# Patient Record
Sex: Female | Born: 1990 | Race: White | Hispanic: No | Marital: Single
Health system: Southern US, Community
[De-identification: ages and names within clinical notes are randomized; demographics above are authoritative.]

---
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
## 2010-12-04 ENCOUNTER — Emergency Department (HOSPITAL_BASED_OUTPATIENT_CLINIC_OR_DEPARTMENT_OTHER)
Admission: EM | Admit: 2010-12-04 | Discharge: 2010-12-05 | Disposition: A | Discharge status: Home / Self Care | Payer: Commercial Managed Care - PPO | Attending: Emergency Medicine | Admitting: Emergency Medicine

## 2010-12-04 DIAGNOSIS — R112 Nausea with vomiting, unspecified: Secondary | ICD-10-CM | POA: Insufficient documentation

## 2010-12-04 DIAGNOSIS — R197 Diarrhea, unspecified: Secondary | ICD-10-CM | POA: Insufficient documentation

## 2010-12-04 LAB — URINALYSIS, ROUTINE W REFLEX MICROSCOPIC
Bilirubin Urine: NEGATIVE
Glucose, UA: NEGATIVE mg/dL
Specific Gravity, Urine: 1.02 (ref 1.005–1.030)
pH: 5.5 (ref 5.0–8.0)

## 2010-12-04 LAB — URINE MICROSCOPIC-ADD ON

## 2010-12-04 LAB — COMPREHENSIVE METABOLIC PANEL
AST: 20 U/L (ref 0–37)
CO2: 22 mEq/L (ref 19–32)
Calcium: 9.9 mg/dL (ref 8.4–10.5)
Chloride: 102 mEq/L (ref 96–112)
Glucose, Bld: 150 mg/dL — ABNORMAL HIGH (ref 70–99)
Potassium: 3.5 mEq/L (ref 3.5–5.1)
Sodium: 142 mEq/L (ref 135–145)

## 2010-12-04 LAB — DIFFERENTIAL
Basophils Absolute: 0 10*3/uL (ref 0.0–0.1)
Lymphocytes Relative: 6 % — ABNORMAL LOW (ref 12–46)
Monocytes Absolute: 1.6 10*3/uL — ABNORMAL HIGH (ref 0.1–1.0)
Neutro Abs: 17 10*3/uL — ABNORMAL HIGH (ref 1.7–7.7)

## 2010-12-04 LAB — CBC
HCT: 42.9 % (ref 36.0–46.0)
Hemoglobin: 15.1 g/dL — ABNORMAL HIGH (ref 12.0–15.0)
MCHC: 35.2 g/dL (ref 30.0–36.0)

## 2013-09-06 ENCOUNTER — Inpatient Hospital Stay: Payer: Self-pay | Admitting: Surgery

## 2013-09-06 LAB — URINALYSIS, COMPLETE
BILIRUBIN, UR: NEGATIVE
GLUCOSE, UR: NEGATIVE mg/dL (ref 0–75)
NITRITE: POSITIVE
PH: 5 (ref 4.5–8.0)
Protein: NEGATIVE
SPECIFIC GRAVITY: 1.021 (ref 1.003–1.030)
WBC UR: 4 /HPF (ref 0–5)

## 2013-09-06 LAB — CBC WITH DIFFERENTIAL/PLATELET
BASOS PCT: 0.3 %
Basophil #: 0 10*3/uL (ref 0.0–0.1)
EOS ABS: 0 10*3/uL (ref 0.0–0.7)
Eosinophil %: 0.1 %
HCT: 38.8 % (ref 35.0–47.0)
HGB: 12.8 g/dL (ref 12.0–16.0)
LYMPHS ABS: 1.5 10*3/uL (ref 1.0–3.6)
LYMPHS PCT: 10.5 %
MCH: 30 pg (ref 26.0–34.0)
MCHC: 33.1 g/dL (ref 32.0–36.0)
MCV: 91 fL (ref 80–100)
Monocyte #: 1.4 x10 3/mm — ABNORMAL HIGH (ref 0.2–0.9)
Monocyte %: 9.6 %
NEUTROS PCT: 79.5 %
Neutrophil #: 11.7 10*3/uL — ABNORMAL HIGH (ref 1.4–6.5)
Platelet: 235 10*3/uL (ref 150–440)
RBC: 4.28 10*6/uL (ref 3.80–5.20)
RDW: 13.2 % (ref 11.5–14.5)
WBC: 14.7 10*3/uL — ABNORMAL HIGH (ref 3.6–11.0)

## 2013-09-06 LAB — COMPREHENSIVE METABOLIC PANEL
ALBUMIN: 3.7 g/dL (ref 3.4–5.0)
Alkaline Phosphatase: 61 U/L
Anion Gap: 4 — ABNORMAL LOW (ref 7–16)
BUN: 11 mg/dL (ref 7–18)
Bilirubin,Total: 0.6 mg/dL (ref 0.2–1.0)
CHLORIDE: 106 mmol/L (ref 98–107)
CO2: 26 mmol/L (ref 21–32)
Calcium, Total: 8.8 mg/dL (ref 8.5–10.1)
Creatinine: 0.75 mg/dL (ref 0.60–1.30)
EGFR (African American): >60
Glucose: 78 mg/dL (ref 65–99)
Osmolality: 270 (ref 275–301)
POTASSIUM: 3.9 mmol/L (ref 3.5–5.1)
SGOT(AST): 27 U/L (ref 15–37)
SGPT (ALT): 19 U/L (ref 12–78)
SODIUM: 136 mmol/L (ref 136–145)
TOTAL PROTEIN: 7.9 g/dL (ref 6.4–8.2)

## 2013-09-06 LAB — LIPASE, BLOOD: Lipase: 88 U/L (ref 73–393)

## 2013-09-07 LAB — BASIC METABOLIC PANEL
Anion Gap: 3 — ABNORMAL LOW (ref 7–16)
BUN: 9 mg/dL (ref 7–18)
CHLORIDE: 105 mmol/L (ref 98–107)
CREATININE: 0.86 mg/dL (ref 0.60–1.30)
Calcium, Total: 8.3 mg/dL — ABNORMAL LOW (ref 8.5–10.1)
Co2: 29 mmol/L (ref 21–32)
EGFR (Non-African Amer.): >60
GLUCOSE: 128 mg/dL — AB (ref 65–99)
Osmolality: 274 (ref 275–301)
Potassium: 4 mmol/L (ref 3.5–5.1)
Sodium: 137 mmol/L (ref 136–145)

## 2013-09-07 LAB — CBC WITH DIFFERENTIAL/PLATELET
BASOS ABS: 0 10*3/uL (ref 0.0–0.1)
BASOS PCT: 0.1 %
EOS PCT: 0 %
Eosinophil #: 0 10*3/uL (ref 0.0–0.7)
HCT: 33.4 % — ABNORMAL LOW (ref 35.0–47.0)
HGB: 11.5 g/dL — AB (ref 12.0–16.0)
Lymphocyte #: 0.9 10*3/uL — ABNORMAL LOW (ref 1.0–3.6)
Lymphocyte %: 6 %
MCH: 31.2 pg (ref 26.0–34.0)
MCHC: 34.3 g/dL (ref 32.0–36.0)
MCV: 91 fL (ref 80–100)
MONOS PCT: 6.6 %
Monocyte #: 1 x10 3/mm — ABNORMAL HIGH (ref 0.2–0.9)
NEUTROS ABS: 13.6 10*3/uL — AB (ref 1.4–6.5)
Neutrophil %: 87.3 %
PLATELETS: 198 10*3/uL (ref 150–440)
RBC: 3.68 10*6/uL — AB (ref 3.80–5.20)
RDW: 13.1 % (ref 11.5–14.5)
WBC: 15.6 10*3/uL — ABNORMAL HIGH (ref 3.6–11.0)

## 2013-09-08 LAB — CBC WITH DIFFERENTIAL/PLATELET
BASOS ABS: 0 10*3/uL (ref 0.0–0.1)
Basophil %: 0.1 %
EOS PCT: 0 %
Eosinophil #: 0 10*3/uL (ref 0.0–0.7)
HCT: 35.9 % (ref 35.0–47.0)
HGB: 11.8 g/dL — AB (ref 12.0–16.0)
Lymphocyte #: 0.6 10*3/uL — ABNORMAL LOW (ref 1.0–3.6)
Lymphocyte %: 3.6 %
MCH: 30 pg (ref 26.0–34.0)
MCHC: 32.8 g/dL (ref 32.0–36.0)
MCV: 92 fL (ref 80–100)
MONOS PCT: 5 %
Monocyte #: 0.8 x10 3/mm (ref 0.2–0.9)
NEUTROS ABS: 15.1 10*3/uL — AB (ref 1.4–6.5)
NEUTROS PCT: 91.3 %
Platelet: 257 10*3/uL (ref 150–440)
RBC: 3.92 10*6/uL (ref 3.80–5.20)
RDW: 13 % (ref 11.5–14.5)
WBC: 16.6 10*3/uL — ABNORMAL HIGH (ref 3.6–11.0)

## 2013-09-08 LAB — BASIC METABOLIC PANEL
ANION GAP: 4 — AB (ref 7–16)
BUN: 9 mg/dL (ref 7–18)
CALCIUM: 8.7 mg/dL (ref 8.5–10.1)
CO2: 29 mmol/L (ref 21–32)
Chloride: 101 mmol/L (ref 98–107)
Creatinine: 0.87 mg/dL (ref 0.60–1.30)
EGFR (African American): >60
EGFR (Non-African Amer.): >60
GLUCOSE: 127 mg/dL — AB (ref 65–99)
Osmolality: 269 (ref 275–301)
POTASSIUM: 4.4 mmol/L (ref 3.5–5.1)
Sodium: 134 mmol/L — ABNORMAL LOW (ref 136–145)

## 2013-09-09 LAB — BASIC METABOLIC PANEL
Anion Gap: 3 — ABNORMAL LOW (ref 7–16)
BUN: 8 mg/dL (ref 7–18)
CO2: 27 mmol/L (ref 21–32)
Calcium, Total: 7.2 mg/dL — ABNORMAL LOW (ref 8.5–10.1)
Chloride: 108 mmol/L — ABNORMAL HIGH (ref 98–107)
Creatinine: 0.7 mg/dL (ref 0.60–1.30)
GLUCOSE: 78 mg/dL (ref 65–99)
OSMOLALITY: 273 (ref 275–301)
Potassium: 3.6 mmol/L (ref 3.5–5.1)
SODIUM: 138 mmol/L (ref 136–145)

## 2013-09-09 LAB — CBC WITH DIFFERENTIAL/PLATELET
BASOS ABS: 0 10*3/uL (ref 0.0–0.1)
BASOS PCT: 0.2 %
EOS ABS: 0.1 10*3/uL (ref 0.0–0.7)
Eosinophil %: 0.5 %
HCT: 34.4 % — ABNORMAL LOW (ref 35.0–47.0)
HGB: 11.6 g/dL — ABNORMAL LOW (ref 12.0–16.0)
LYMPHS PCT: 9 %
Lymphocyte #: 1 10*3/uL (ref 1.0–3.6)
MCH: 30.8 pg (ref 26.0–34.0)
MCHC: 33.7 g/dL (ref 32.0–36.0)
MCV: 91 fL (ref 80–100)
MONOS PCT: 7.9 %
Monocyte #: 0.9 x10 3/mm (ref 0.2–0.9)
NEUTROS PCT: 82.4 %
Neutrophil #: 8.9 10*3/uL — ABNORMAL HIGH (ref 1.4–6.5)
PLATELETS: 239 10*3/uL (ref 150–440)
RBC: 3.77 10*6/uL — ABNORMAL LOW (ref 3.80–5.20)
RDW: 13.3 % (ref 11.5–14.5)
WBC: 10.8 10*3/uL (ref 3.6–11.0)

## 2013-09-11 LAB — CBC WITH DIFFERENTIAL/PLATELET
BASOS ABS: 0 10*3/uL (ref 0.0–0.1)
Basophil %: 0.3 %
EOS ABS: 0.1 10*3/uL (ref 0.0–0.7)
Eosinophil %: 0.9 %
HCT: 34.9 % — ABNORMAL LOW (ref 35.0–47.0)
HGB: 11.5 g/dL — ABNORMAL LOW (ref 12.0–16.0)
Lymphocyte #: 1.2 10*3/uL (ref 1.0–3.6)
Lymphocyte %: 8 %
MCH: 29.7 pg (ref 26.0–34.0)
MCHC: 33 g/dL (ref 32.0–36.0)
MCV: 90 fL (ref 80–100)
MONOS PCT: 9.1 %
Monocyte #: 1.4 x10 3/mm — ABNORMAL HIGH (ref 0.2–0.9)
Neutrophil #: 12.2 10*3/uL — ABNORMAL HIGH (ref 1.4–6.5)
Neutrophil %: 81.7 %
Platelet: 264 10*3/uL (ref 150–440)
RBC: 3.87 10*6/uL (ref 3.80–5.20)
RDW: 13.4 % (ref 11.5–14.5)
WBC: 14.9 10*3/uL — AB (ref 3.6–11.0)

## 2013-09-11 LAB — PATHOLOGY REPORT

## 2013-09-18 ENCOUNTER — Ambulatory Visit: Payer: Self-pay | Admitting: Surgery

## 2013-09-18 LAB — URINALYSIS, COMPLETE
Bilirubin,UR: NEGATIVE
Glucose,UR: NEGATIVE mg/dL
Ketone: NEGATIVE
Leukocyte Esterase: NEGATIVE
Nitrite: NEGATIVE
Ph: 6.5
Protein: 30
Specific Gravity: 1.025

## 2013-09-19 ENCOUNTER — Inpatient Hospital Stay: Payer: Self-pay | Admitting: Surgery

## 2013-09-19 ENCOUNTER — Ambulatory Visit: Payer: Self-pay | Admitting: Surgery

## 2013-09-20 LAB — CBC WITH DIFFERENTIAL/PLATELET
BASOS PCT: 0.3 %
Basophil #: 0 10*3/uL (ref 0.0–0.1)
EOS PCT: 0.1 %
Eosinophil #: 0 10*3/uL (ref 0.0–0.7)
HCT: 31.2 % — AB (ref 35.0–47.0)
HGB: 10.5 g/dL — ABNORMAL LOW (ref 12.0–16.0)
LYMPHS ABS: 2.1 10*3/uL (ref 1.0–3.6)
Lymphocyte %: 13.2 %
MCH: 30.1 pg (ref 26.0–34.0)
MCHC: 33.6 g/dL (ref 32.0–36.0)
MCV: 90 fL (ref 80–100)
MONO ABS: 1.8 x10 3/mm — AB (ref 0.2–0.9)
Monocyte %: 11.3 %
NEUTROS PCT: 75.1 %
Neutrophil #: 11.7 10*3/uL — ABNORMAL HIGH (ref 1.4–6.5)
PLATELETS: 423 10*3/uL (ref 150–440)
RBC: 3.48 10*6/uL — AB (ref 3.80–5.20)
RDW: 13.6 % (ref 11.5–14.5)
WBC: 15.6 10*3/uL — AB (ref 3.6–11.0)

## 2013-09-20 LAB — APTT: ACTIVATED PTT: 49.6 s — AB (ref 23.6–35.9)

## 2013-09-20 LAB — PROTIME-INR
INR: 1.2
Prothrombin Time: 15 secs — ABNORMAL HIGH (ref 11.5–14.7)

## 2013-09-20 LAB — BASIC METABOLIC PANEL
Anion Gap: 4 — ABNORMAL LOW (ref 7–16)
BUN: 10 mg/dL (ref 7–18)
CALCIUM: 8.4 mg/dL — AB (ref 8.5–10.1)
CO2: 28 mmol/L (ref 21–32)
CREATININE: 0.81 mg/dL (ref 0.60–1.30)
Chloride: 103 mmol/L (ref 98–107)
GLUCOSE: 88 mg/dL (ref 65–99)
Osmolality: 269 (ref 275–301)
Potassium: 4 mmol/L (ref 3.5–5.1)
Sodium: 135 mmol/L — ABNORMAL LOW (ref 136–145)

## 2013-09-21 LAB — CBC WITH DIFFERENTIAL/PLATELET
BASOS PCT: 0.2 %
Basophil #: 0 10*3/uL (ref 0.0–0.1)
Eosinophil #: 0 10*3/uL (ref 0.0–0.7)
Eosinophil %: 0 %
HCT: 29.1 % — AB (ref 35.0–47.0)
HGB: 9.5 g/dL — ABNORMAL LOW (ref 12.0–16.0)
LYMPHS ABS: 1.3 10*3/uL (ref 1.0–3.6)
LYMPHS PCT: 7.2 %
MCH: 29.1 pg (ref 26.0–34.0)
MCHC: 32.5 g/dL (ref 32.0–36.0)
MCV: 90 fL (ref 80–100)
MONOS PCT: 8.4 %
Monocyte #: 1.6 x10 3/mm — ABNORMAL HIGH (ref 0.2–0.9)
NEUTROS ABS: 15.7 10*3/uL — AB (ref 1.4–6.5)
NEUTROS PCT: 84.2 %
Platelet: 424 10*3/uL (ref 150–440)
RBC: 3.25 10*6/uL — AB (ref 3.80–5.20)
RDW: 13.5 % (ref 11.5–14.5)
WBC: 18.6 10*3/uL — AB (ref 3.6–11.0)

## 2013-09-25 LAB — CBC WITH DIFFERENTIAL/PLATELET
BASOS PCT: 1.1 %
Basophil #: 0.1 10*3/uL (ref 0.0–0.1)
EOS ABS: 0.1 10*3/uL (ref 0.0–0.7)
EOS PCT: 1.1 %
HCT: 30.9 % — AB (ref 35.0–47.0)
HGB: 10.5 g/dL — ABNORMAL LOW (ref 12.0–16.0)
LYMPHS PCT: 24.4 %
Lymphocyte #: 1.9 10*3/uL (ref 1.0–3.6)
MCH: 30.3 pg (ref 26.0–34.0)
MCHC: 34 g/dL (ref 32.0–36.0)
MCV: 89 fL (ref 80–100)
MONO ABS: 1 x10 3/mm — AB (ref 0.2–0.9)
Monocyte %: 12.5 %
Neutrophil #: 4.8 10*3/uL (ref 1.4–6.5)
Neutrophil %: 60.9 %
PLATELETS: 481 10*3/uL — AB (ref 150–440)
RBC: 3.46 10*6/uL — ABNORMAL LOW (ref 3.80–5.20)
RDW: 13.8 % (ref 11.5–14.5)
WBC: 7.9 10*3/uL (ref 3.6–11.0)

## 2013-09-26 LAB — URINALYSIS, COMPLETE
BLOOD: NEGATIVE
Bilirubin,UR: NEGATIVE
Glucose,UR: NEGATIVE mg/dL (ref 0–75)
Ketone: NEGATIVE
NITRITE: NEGATIVE
PH: 7 (ref 4.5–8.0)
Protein: NEGATIVE
SPECIFIC GRAVITY: 1.018 (ref 1.003–1.030)

## 2013-09-27 LAB — CBC WITH DIFFERENTIAL/PLATELET
Basophil: 2 %
EOS PCT: 4 %
HCT: 32.7 % — ABNORMAL LOW (ref 35.0–47.0)
HGB: 10.9 g/dL — AB (ref 12.0–16.0)
Lymphocytes: 37 %
MCH: 29.9 pg (ref 26.0–34.0)
MCHC: 33.4 g/dL (ref 32.0–36.0)
MCV: 90 fL (ref 80–100)
METAMYELOCYTE: 1 %
MONOS PCT: 11 %
PLATELETS: 507 10*3/uL — AB (ref 150–440)
RBC: 3.64 10*6/uL — AB (ref 3.80–5.20)
RDW: 14 % (ref 11.5–14.5)
SEGMENTED NEUTROPHILS: 45 %
WBC: 6.4 10*3/uL (ref 3.6–11.0)

## 2013-09-27 LAB — BASIC METABOLIC PANEL
Anion Gap: 3 — ABNORMAL LOW (ref 7–16)
BUN: 13 mg/dL (ref 7–18)
CALCIUM: 8.9 mg/dL (ref 8.5–10.1)
Chloride: 105 mmol/L (ref 98–107)
Co2: 29 mmol/L (ref 21–32)
Creatinine: 0.85 mg/dL (ref 0.60–1.30)
EGFR (African American): >60
Glucose: 88 mg/dL (ref 65–99)
OSMOLALITY: 273 (ref 275–301)
Potassium: 4.6 mmol/L (ref 3.5–5.1)
SODIUM: 137 mmol/L (ref 136–145)

## 2013-09-27 LAB — BODY FLUID CULTURE

## 2013-10-05 ENCOUNTER — Other Ambulatory Visit: Payer: Self-pay | Admitting: Surgery

## 2013-10-05 LAB — CBC WITH DIFFERENTIAL/PLATELET
BASOS ABS: 0.1 10*3/uL (ref 0.0–0.1)
Basophil %: 2 %
EOS ABS: 0.1 10*3/uL (ref 0.0–0.7)
Eosinophil %: 1.9 %
HCT: 36.6 % (ref 35.0–47.0)
HGB: 11.9 g/dL — AB (ref 12.0–16.0)
Lymphocyte #: 2.6 10*3/uL (ref 1.0–3.6)
Lymphocyte %: 41.5 %
MCH: 29.5 pg (ref 26.0–34.0)
MCHC: 32.5 g/dL (ref 32.0–36.0)
MCV: 91 fL (ref 80–100)
MONO ABS: 0.7 x10 3/mm (ref 0.2–0.9)
Monocyte %: 11.6 %
NEUTROS ABS: 2.7 10*3/uL (ref 1.4–6.5)
NEUTROS PCT: 43 %
Platelet: 300 10*3/uL (ref 150–440)
RBC: 4.03 10*6/uL (ref 3.80–5.20)
RDW: 15.3 % — ABNORMAL HIGH (ref 11.5–14.5)
WBC: 6.2 10*3/uL (ref 3.6–11.0)

## 2013-10-05 LAB — BASIC METABOLIC PANEL
ANION GAP: 4 — AB (ref 7–16)
BUN: 22 mg/dL — ABNORMAL HIGH (ref 7–18)
CALCIUM: 9 mg/dL (ref 8.5–10.1)
CHLORIDE: 108 mmol/L — AB (ref 98–107)
CREATININE: 0.86 mg/dL (ref 0.60–1.30)
Co2: 28 mmol/L (ref 21–32)
EGFR (Non-African Amer.): >60
GLUCOSE: 120 mg/dL — AB (ref 65–99)
Osmolality: 284 (ref 275–301)
Potassium: 4.6 mmol/L (ref 3.5–5.1)
Sodium: 140 mmol/L (ref 136–145)

## 2014-06-13 LAB — LIPASE, BLOOD: Lipase: 95 U/L (ref 73–393)

## 2014-06-13 LAB — COMPREHENSIVE METABOLIC PANEL
AST: 12 U/L — AB (ref 15–37)
Albumin: 4.2 g/dL (ref 3.4–5.0)
Alkaline Phosphatase: 57 U/L
Anion Gap: 8 (ref 7–16)
BILIRUBIN TOTAL: 0.9 mg/dL (ref 0.2–1.0)
BUN: 13 mg/dL (ref 7–18)
CALCIUM: 9 mg/dL (ref 8.5–10.1)
CO2: 27 mmol/L (ref 21–32)
CREATININE: 0.92 mg/dL (ref 0.60–1.30)
Chloride: 107 mmol/L (ref 98–107)
GLUCOSE: 88 mg/dL (ref 65–99)
OSMOLALITY: 283 (ref 275–301)
Potassium: 3.4 mmol/L — ABNORMAL LOW (ref 3.5–5.1)
SGPT (ALT): 19 U/L
SODIUM: 142 mmol/L (ref 136–145)
TOTAL PROTEIN: 8.1 g/dL (ref 6.4–8.2)

## 2014-06-13 LAB — URINALYSIS, COMPLETE
Bilirubin,UR: NEGATIVE
GLUCOSE, UR: NEGATIVE mg/dL (ref 0–75)
Ketone: NEGATIVE
NITRITE: POSITIVE
PH: 6 (ref 4.5–8.0)
Specific Gravity: 1.012 (ref 1.003–1.030)

## 2014-06-13 LAB — CBC WITH DIFFERENTIAL/PLATELET
BASOS PCT: 0.3 %
Basophil #: 0 10*3/uL (ref 0.0–0.1)
EOS ABS: 0 10*3/uL (ref 0.0–0.7)
EOS PCT: 0 %
HCT: 42.1 % (ref 35.0–47.0)
HGB: 13.9 g/dL (ref 12.0–16.0)
LYMPHS ABS: 1.3 10*3/uL (ref 1.0–3.6)
LYMPHS PCT: 10.2 %
MCH: 30.6 pg (ref 26.0–34.0)
MCHC: 33 g/dL (ref 32.0–36.0)
MCV: 93 fL (ref 80–100)
Monocyte #: 1.2 x10 3/mm — ABNORMAL HIGH (ref 0.2–0.9)
Monocyte %: 9 %
NEUTROS ABS: 10.4 10*3/uL — AB (ref 1.4–6.5)
Neutrophil %: 80.5 %
PLATELETS: 192 10*3/uL (ref 150–440)
RBC: 4.52 10*6/uL (ref 3.80–5.20)
RDW: 13.6 % (ref 11.5–14.5)
WBC: 13 10*3/uL — AB (ref 3.6–11.0)

## 2014-06-13 LAB — PROTIME-INR
INR: 1
PROTHROMBIN TIME: 13.4 s (ref 11.5–14.7)

## 2014-06-13 LAB — MAGNESIUM: Magnesium: 2 mg/dL

## 2014-06-14 ENCOUNTER — Inpatient Hospital Stay: Payer: Self-pay | Admitting: Internal Medicine

## 2014-06-14 LAB — D-DIMER(ARMC)
D-DIMER: 435 ng/mL
D-DIMER: 692 ng/mL

## 2014-06-14 LAB — PHOSPHORUS: PHOSPHORUS: 3 mg/dL (ref 2.5–4.9)

## 2014-06-14 LAB — PREGNANCY, URINE: Pregnancy Test, Urine: NEGATIVE m[IU]/mL

## 2014-06-14 LAB — TROPONIN I: Troponin-I: <0.02 ng/mL

## 2014-06-15 LAB — CBC WITH DIFFERENTIAL/PLATELET
Basophil #: 0 10*3/uL (ref 0.0–0.1)
Basophil %: 0.3 %
Eosinophil #: 0 10*3/uL (ref 0.0–0.7)
Eosinophil %: 0 %
HCT: 32.6 % — ABNORMAL LOW (ref 35.0–47.0)
HGB: 10.9 g/dL — ABNORMAL LOW (ref 12.0–16.0)
LYMPHS ABS: 1.6 10*3/uL (ref 1.0–3.6)
Lymphocyte %: 15.3 %
MCH: 31.2 pg (ref 26.0–34.0)
MCHC: 33.3 g/dL (ref 32.0–36.0)
MCV: 94 fL (ref 80–100)
MONO ABS: 1 x10 3/mm — AB (ref 0.2–0.9)
MONOS PCT: 9.8 %
NEUTROS ABS: 7.9 10*3/uL — AB (ref 1.4–6.5)
NEUTROS PCT: 74.6 %
PLATELETS: 140 10*3/uL — AB (ref 150–440)
RBC: 3.48 10*6/uL — AB (ref 3.80–5.20)
RDW: 13.5 % (ref 11.5–14.5)
WBC: 10.6 10*3/uL (ref 3.6–11.0)

## 2014-06-15 LAB — BASIC METABOLIC PANEL
Anion Gap: 7 (ref 7–16)
BUN: 8 mg/dL (ref 7–18)
Calcium, Total: 7.5 mg/dL — ABNORMAL LOW (ref 8.5–10.1)
Chloride: 110 mmol/L — ABNORMAL HIGH (ref 98–107)
Co2: 23 mmol/L (ref 21–32)
Creatinine: 0.91 mg/dL (ref 0.60–1.30)
EGFR (African American): >60
Glucose: 90 mg/dL (ref 65–99)
OSMOLALITY: 277 (ref 275–301)
Potassium: 3.6 mmol/L (ref 3.5–5.1)
Sodium: 140 mmol/L (ref 136–145)

## 2014-06-15 LAB — COMPREHENSIVE METABOLIC PANEL
ALK PHOS: 41 U/L — AB
AST: 15 U/L (ref 15–37)
Albumin: 2.4 g/dL — ABNORMAL LOW (ref 3.4–5.0)
Bilirubin,Total: 0.3 mg/dL (ref 0.2–1.0)
SGPT (ALT): 15 U/L
Total Protein: 5.3 g/dL — ABNORMAL LOW (ref 6.4–8.2)

## 2014-06-16 LAB — URINE CULTURE

## 2014-06-17 LAB — CBC WITH DIFFERENTIAL/PLATELET
BASOS ABS: 0 10*3/uL (ref 0.0–0.1)
BASOS PCT: 0.4 %
EOS PCT: 1.4 %
Eosinophil #: 0.1 10*3/uL (ref 0.0–0.7)
HCT: 34.5 % — ABNORMAL LOW (ref 35.0–47.0)
HGB: 11.5 g/dL — ABNORMAL LOW (ref 12.0–16.0)
LYMPHS PCT: 26.8 %
Lymphocyte #: 1.8 10*3/uL (ref 1.0–3.6)
MCH: 30.7 pg (ref 26.0–34.0)
MCHC: 33.3 g/dL (ref 32.0–36.0)
MCV: 92 fL (ref 80–100)
MONOS PCT: 10.9 %
Monocyte #: 0.7 x10 3/mm (ref 0.2–0.9)
NEUTROS PCT: 60.5 %
Neutrophil #: 4 10*3/uL (ref 1.4–6.5)
PLATELETS: 203 10*3/uL (ref 150–440)
RBC: 3.74 10*6/uL — ABNORMAL LOW (ref 3.80–5.20)
RDW: 13.5 % (ref 11.5–14.5)
WBC: 6.6 10*3/uL (ref 3.6–11.0)

## 2014-06-17 LAB — BASIC METABOLIC PANEL
Anion Gap: 6 — ABNORMAL LOW (ref 7–16)
BUN: 6 mg/dL — AB (ref 7–18)
CALCIUM: 7.9 mg/dL — AB (ref 8.5–10.1)
Chloride: 110 mmol/L — ABNORMAL HIGH (ref 98–107)
Co2: 26 mmol/L (ref 21–32)
Creatinine: 0.87 mg/dL (ref 0.60–1.30)
EGFR (African American): >60
GLUCOSE: 112 mg/dL — AB (ref 65–99)
Osmolality: 281 (ref 275–301)
Potassium: 3.4 mmol/L — ABNORMAL LOW (ref 3.5–5.1)
Sodium: 142 mmol/L (ref 136–145)

## 2014-06-19 LAB — CULTURE, BLOOD (SINGLE)

## 2014-10-27 NOTE — Op Note (Signed)
PATIENT NAME:  Heather Gonzalez, [name] MR#:  [id number] DATE OF BIRTH:  January 23, 1991  DATE OF PROCEDURE:  09/07/2013  PREOPERATIVE DIAGNOSIS: Acute appendicitis with peritoneal abscess.   POSTOPERATIVE DIAGNOSIS: Acute appendicitis with peritoneal abscess.   OPERATION: Laparoscopy, conversion to open appendectomy, drainage of abdominal abscess.   ANESTHESIA: General.   SURGEON: Quentin Orealph L. Ely, MD  OPERATIVE PROCEDURE: With the patient in the supine position after the induction of appropriate general, the patient's abdomen was prepped with ChloraPrep and draped with sterile towels. A Foley catheter had been previously placed. The patient was placed in the head-down, feet-up position.   A small infraumbilical incision was made in the standard fashion, carried down bluntly through the subcutaneous tissue. The Veress needle was used to cannulate the peritoneal cavity. CO2 was insufflated to appropriate pressure measurements. When approximately 2.5 L of CO2 was  instilled, the Veress needle was withdrawn. An 11 mm applied medical port was inserted into the peritoneal cavity. Intraperitoneal position was confirmed, and CO2 was reinsufflated.   The right lower quadrant was investigated. There was a significant amount of omentum in the terminal ileum area that appeared to be adherent to the colon. A transverse midepigastric incision was made and the 11 mm port inserted under direct vision. A suprapubic transverse incision was made and the 12 mm port inserted under direct vision.   The camera was moved to the upper port and dissection carried out through the two lower ports. Peeling the small intestine off the colon revealed a large foul-smelling purulent abscess which was irrigated and aspirated. The appendix was clearly necrotic and gangrenous. I could not  mobilize the appendix. The base was adherent to the pelvic side wall.   We marked the abdominal wall for open incision through the laparoscope and abandoned  laparoscopy.   The right lower quadrant incision was made in the standard fashion, carried down sharply through the subcutaneous tissue. Bovie electrocautery was used to provide hemostasis. The muscle was divided with the Bovie as well as the anterior fashion peritoneum. The abdominal cavity was opened. The Alexis wound retractor was used for skin protection. The cecum and terminal ileum were elevated in the incision. The appendix was almost disintegrated as it was so necrotic. The base was identified and appeared to be viable. The base was stabled with a single application of the Endo GIA stapling device carrying a blue load. The mesoappendix was divided with a single application of the Endo GIA stapler carrying a white load.   The appendix was then captured and taken off the table. The area was copiously irrigated with 2 L of warm saline solution. Because there was a discrete abscess, a 7 mm flat Jackson-Pratt drain was inserted through the suprapubic incision and placed in the bed of the abscess. It was secured in place with 3-0 nylon. The posterior peritoneum was closed with a running suture of Vicryl. The anterior fascia was closed with interrupted figure-of-eight sutures of 0 Maxon. The area was irrigated. Skin was closed loosely with 3-0 nylon, benzoin and Steri-Strips. Sterile dressings were applied. The patient was returned to the recovery room, having tolerated the procedure well. Sponge, instrument and needle counts were correct x2 in the operating room.    ____________________________ Quentin Orealph L. Ely III, MD rle:np D: 09/07/2013 14:49:51 ET T: 09/07/2013 22:04:47 ET JOB#: [id number]  cc: Quentin Orealph L. Ely III, MD, <Dictator> Quentin OreALPH L ELY MD ELECTRONICALLY SIGNED 09/08/2013 12:30

## 2014-10-27 NOTE — H&P (Signed)
PATIENT NAME:  Heather Gonzalez, Heather Gonzalez MR#:  [id number] DATE OF BIRTH:  1990/08/21  DATE OF ADMISSION:  09/19/2013  CHIEF COMPLAINT: Abdominal pain.   HISTORY OF PRESENT ILLNESS: This is a patient who is status post appendectomy for ruptured appendicitis with a complicated postoperative course. She was doing fairly well, but has had increased pain recently. She saw Dr. Egbert GaribaldiBird in the office who ordered a CT scan which was done as an outpatient that suggested a pelvic abscess quite large in nature. She was sent to the hospital for direct admission where I am assuming her care.   The patient denies fevers, but has had worsening pain. She has problems urinating and feels pelvic pressure and low back pain.   The patient underwent a conversion of laparoscopic to open appendectomy for ruptured appendicitis with drain placement on the 5th of March and was treated with IV antibiotics. She was sent home and was doing fairly well but has now had increasing pain.   PAST MEDICAL HISTORY: None.   PAST SURGICAL HISTORY: Arm and finger fracture.   MEDICATIONS: Implantable birth control device.   ALLERGIES: None.   FAMILY HISTORY: Noncontributory.   SOCIAL HISTORY: The patient does not smoke. Drinks alcohol rarely.   REVIEW OF SYSTEMS: A 10 system review was performed and negative with the exception of that mentioned in the HPI.   PHYSICAL EXAMINATION: GENERAL: Healthy, comfortable -appearing female patient.   VITAL SIGNS: Temperature 98.5, pulse 112, respirations 16, blood pressure 105/67.  HEENT: No scleral icterus.  NECK: No palpable neck nodes.  CHEST: Clear to auscultation.  CARDIAC: Regular rate and rhythm.  ABDOMEN: Slightly distended, minimally tender. Right lower quadrant incision is healing without erythema. Periumbilical and suprapubic incisions are healing as well, without erythema or drainage.   Urinalysis done yesterday showed 3+ blood, 2+ bacteria. No other labs were drawn today. The CT scan  demonstrates a 5 x 7 x 7 cm abscess in the pelvis. This has been personally reviewed.   ASSESSMENT AND PLAN: Pelvic abscess following ruptured appendicitis. I will discuss the CT findings with Dr. Michela PitcherEly and then with radiology. If radiology can drain this percutaneously using CT or ultrasound guidance that would be the best and first choice in this patient. IV antibiotics have been started. The second alternative would be to return to the Operating Room and attempt laparoscopy with the potential for changing this to an open procedure. This has been discussed with she and her family. I will discuss with Dr. Michela PitcherEly and as it is late hour, at this point, I would probably plan for discussion with radiology first thing in the morning. The patient understood this plan.   ____________________________ Adah Salvageichard E. Excell Seltzerooper, MD rec:sg D: 09/19/2013 18:10:01 ET T: 09/19/2013 19:01:15 ET JOB#: [id number]  cc: Adah Salvageichard E. Excell Seltzerooper, MD, <Dictator> Lattie HawICHARD E Conley Pawling MD ELECTRONICALLY SIGNED 09/20/2013 14:08

## 2014-10-27 NOTE — H&P (Signed)
PATIENT NAME:  Heather Gonzalez, Heather Gonzalez MR#:  [id number] DATE OF BIRTH:  July 04, 1991  DATE OF ADMISSION:  06/14/2014  REFERRING PHYSICIAN: Douds SinkJade J. Dolores FrameSung, MD    PRIMARY CARE PHYSICIAN: Danella PentonMark F. Miller, MD  CHIEF COMPLAINT: Right side pain.   HISTORY OF PRESENT ILLNESS: A 24 year old Caucasian female without significant past medical history presenting with right-sided pain. Describes right flank pain, which originally started 4-5 days ago and has been progressively worsening since then. Describes the pain as dull, nonradiating, 6-7 out of 10 in intensity with associated 1-2 day duration of fevers, chills, dysuria. She also described one episode of diaphoresis. Denies any change in urinary frequency. Upon arrival to the Emergency Department she was noted to be febrile at 102.3 degrees Fahrenheit. Initiate sepsis workup.   REVIEW OF SYSTEMS:  CONSTITUTIONAL: Positive for fevers, chills, fatigue, weakness. EYES: Denies blurry vision, double vision, eye pain. EAR, NOSE, THROAT: Denies tinnitus, ear pain, hearing loss. RESPIRATORY: Positive for cough. Denies wheezing.   CARDIOVASCULAR: Denies chest pain, palpitations, edema.  GASTROINTESTINAL: Denies nausea, vomiting, diarrhea, or abdominal pain.  GENITOURINARY: Positive for dysuria, however, denies any hematuria or change in urinary frequency.  ENDOCRINE: Denies nocturia or thyroid problems.  HEMATOLOGY AND LYMPHATIC: Denies easy bruising or bleeding.  SKIN: Denies rash or lesion.  MUSCULOSKELETAL: Denies pain in neck, back, shoulder, knees, hips, or arthritic symptoms.  NEUROLOGIC: Denies paralysis, paresthesias.  PSYCHIATRIC: Denies anxiety or depressive symptoms. Otherwise, a full review of systems performed by me is negative.   PAST MEDICAL HISTORY: None.   SOCIAL HISTORY: Positive for occasional alcohol use. Denies any tobacco or drug usage.  FAMILY HISTORY: Positive for coronary artery disease, as well as history of PE in her brother.    ALLERGIES: CODEINE.   HOME MEDICATIONS: Include Nexplanon birth control 68 mg  PHYSICAL EXAMINATION:  VITAL SIGNS: Temperature 102.3 degrees Fahrenheit, heart rate 132, respirations 16, blood pressure 97/61, saturating 97% on room air. Weight 59 kg.  GENERAL: Ill-appearing Caucasian female currently in no acute distress. HEAD: Normocephalic, atraumatic.  EYES: Pupils equal, round, reactive to light. Extraocular muscles intact. No scleral icterus.  MOUTH: Dry mucosal membrane. Dentition intact. No abscess noted.  EAR, NOSE, THROAT: Clear without exudates. No external lesions.  NECK: Supple. No thyromegaly. No nodules. No JVD.  PULMONARY: Clear to auscultation bilaterally without wheeze, rales, or rhonchi. No accessory muscle use. Good air entry bilaterally. Good respiratory effort bilaterally.  CARDIOVASCULAR: S1, S2, tachycardic. No murmurs, rubs, or gallops. No edema. Pedal pulses 2+. GASTROINTESTINAL: Soft, nontender, nondistended. No masses. Positive bowel sounds. No hepatosplenomegaly. Right-sided CVA tenderness.  MUSCULOSKELETAL: No swelling, clubbing, or edema. Range of motion full in all extremities.  NEUROLOGIC: Cranial nerves II-XII intact. No gross focal neurological deficits. Sensation intact. Reflexes intact.  SKIN: No ulceration, lesions, rashes, or cyanosis. Skin warm to touch.   PSYCHIATRIC: Mood and affect within normal limits. The patient is awake, alert, oriented x 3. Insight and judgment are intact.   LABORATORY DATA: Sodium of 142, potassium 3.4, chloride 107, bicarbonate 27, BUN 13, creatinine 0.92 glucose of 88. LFTs within normal limits. WBC of 13, hemoglobin 13.9, platelets of 192,000. Urinalysis: WBCs 87, RBCs 4, leukocyte esterase 2+, nitrite positive. Urine pregnancy test negative.   IMAGING: She also has imaging that was performed. Chest x-ray reveals no acute cardiopulmonary process. CT abdomen and pelvis performed which reveals patchy hypodensity of the right  kidney with associated consistent with pyelonephritis as well as right hydronephrosis.   ASSESSMENT AND PLAN: A  24 year old Caucasian female without significant past medical history presented with right-sided pain and found to have pyelonephritis.  1.  Sepsis: Meeting septic criteria by temperature, heart rate, and leukocytosis present on arrival secondary to right-sided pyelonephritis. Panculture including blood, urine. Antibiotic coverage ceftriaxone. Follow culture data. Taper antibiotics accordingly. Provide pain medications as required. 2.  Hypokalemia: Replace, goal 4-5.  3. Venous thromboembolism prophylaxis: Heparin subcutaneous.   CODE STATUS: Patient is full code.   TIME SPENT: 45 minutes.   ____________________________ Cletis Athens. Shamiracle Gorden, MD dkh:bm D: 06/14/2014 02:34:48 ET T: 06/14/2014 04:17:38 ET JOB#: [id number]  cc: Cletis Athens. Keeven Matty, MD, <Dictator> Taci Sterling Synetta Shadow MD ELECTRONICALLY SIGNED 06/19/2014 20:39

## 2014-10-27 NOTE — Discharge Summary (Signed)
PATIENT NAME:  Heather Gonzalez, Zafira M MR#:  [id number] DATE OF BIRTH:  06/27/1991  DATE OF ADMISSION:  09/19/2013 DATE OF DISCHARGE:  09/28/2013  DIAGNOSES:  Post appendicitis abscess, recent history of ruptured appendicitis.   PROCEDURES:  CT-guided drainage of a post appendicitis pelvic abscess.   HISTORY OF PRESENT ILLNESS AND HOSPITAL COURSE:  This is a patient of Dr. Marlowe KaysEly's who underwent an appendectomy for a ruptured appendicitis several days prior to admission.  She was seen by Dr. Egbert GaribaldiBird in the office who sent her for a CT scan which showed a large abscess.  She was admitted to the hospital and started on IV antibiotics and interventional radiology performed a transgluteal drainage.  The drainage catheter was kept in place.  Repeat CT scan showed improvement with no further loculations.  The drain was ultimately removed and she was discharged in stable condition on oral analgesics and oral antibiotics to follow up in Dr. Marlowe KaysEly's office in 10 days.    ____________________________ Adah Salvageichard E. Excell Seltzerooper, MD rec:ea D: 10/04/2013 20:42:13 ET T: 10/05/2013 01:00:39 ET JOB#: [id number]  cc: Adah Salvageichard E. Excell Seltzerooper, MD, <Dictator> Lattie HawICHARD E COOPER MD ELECTRONICALLY SIGNED 10/05/2013 6:49

## 2014-10-27 NOTE — Op Note (Signed)
PATIENT NAME:  Heather Gonzalez, Heather Gonzalez MR#:  [id number] DATE OF BIRTH:  [birth year]  DATE OF PROCEDURE:  09/08/2013  PREOPERATIVE DIAGNOSIS:  Lack of intravenous access, ruptured appendicitis.   POSTOPERATIVE DIAGNOSIS:  Lack of intravenous access, ruptured appendicitis.  OPERATION:  Internal jugular catheter placement.   ANESTHESIA:  Local.   SURGEON:  Dr. Michela PitcherEly.   OPERATIVE PROCEDURE:  With the patient in the supine position the right neck was prepped with ChloraPrep and draped with sterile towels.  1% Xylocaine was injected in the right neck.  The ultrasound probe was used to identify the compressible nonthrombotic internal jugular vein.  The vein was cannulated with a single pass and a wire passed into the great vessel system without difficulty.  The needle was removed.  The skin incision enlarged.  The dilator placed over the wire.  Dilator was removed and a triple-lumen catheter inserted over the wire into the great vessel system.  The wire was removed.  The line sutured in place with 3-0 nylon and silk, sterilely dressed and aspirated and flushed.  Chest x-ray is pending.     ____________________________ Carmie Endalph L. Ely III, MD rle:ea D: 09/08/2013 18:52:44 ET T: 09/09/2013 00:51:05 ET JOB#: [id number]  cc: Quentin Orealph L. Ely III, MD, <Dictator> Quentin OreALPH L ELY MD ELECTRONICALLY SIGNED 09/10/2013 18:28

## 2014-10-27 NOTE — H&P (Signed)
Subjective/Chief Complaint Suprapubic pain x 10 days, nausea   History of Present Illness Heather Gonzalez is a pleasant healthy 24 yo F who presents with 10 days of suprapubic pain.  She says that it began suddenly.  Worsened on day 4 of pain and attempted to go to urgent care at that time but was deterred by long wait.  Was seen 3 days ago at urgent care and dx with UTI and began on antibiotic.  Did not improve and saw OB today and obtained ultrasound.  Plan was to go home from ultrasound but continued to feel bad and presented to ER.  + some nausea.  Feels bloated.  Able to keep down liquids and food but poor appetite.  Subjective chills.  Has had UTI before and does not feel like UTI.  Last ate at 5:30 pm.  No sick contacts.  Worse with movement but has continued to workout.  Pain mostly now comes in waves and is crampy.   Past History H/o arm surgery H/o pinky fracture.   ALLERGIES:  No Known Allergies:    Medications Has implant birth control   Family and Social History:  Family History Coronary Artery Disease  Hypertension  Cancer  Breast, stomach, uterine cancer   Social History negative tobacco, positive ETOH, Social EtOH   [place], here with mom/dad/sister   Review of Systems:  Subjective/Chief Complaint Suprapubic pain, abdominal distention, some nausea   Fever/Chills Yes   Cough No   Sputum No   Abdominal Pain Yes   Diarrhea No   Constipation No   Nausea/Vomiting Yes   SOB/DOE No   Chest Pain No   Dysuria Yes   Tolerating Diet Yes  Nauseated   Physical Exam:  GEN well developed, no acute distress   HEENT pink conjunctivae, moist oral mucosa   RESP normal resp effort  no use of accessory muscles   CARD regular rate  no murmur  no thrills   ABD positive tenderness  no hernia  soft  Mild tender suprapubic, no peritonitis   EXTR negative cyanosis/clubbing, negative edema   SKIN normal to palpation, No rashes, No ulcers   NEURO  cranial nerves intact, negative rigidity, negative tremor, strength:, motor/sensory function intact   PSYCH A+O to time, place, person, good insight    Assessment/Admission Diagnosis Heather Gonzalez is a pleasant 24 yo F with 10 days of suprapubic pain, nausea.  + leukocytosis.  CT shows fluid collection and surrounding inflammation suspicious for ruptured appendicitis.  Relatively nontender.   Plan I have discussed with Heather Gonzalez and her family that she likely has ruptured appendicitis and have recommended appendectomy and surgical drainage of abscess.  She is not critically ill and has had ruptured appendix for probably a few days.  I have offered appendectomy at approx 1:30 am (after 8 hours NPO) with myself vs appendectomy in am with Dr. Michela PitcherEly.  I feel that there is a better chance of being able to perform laparoscopic appendectomy with a full OR crew during daylight hours but told her that ther is a good chance that she may require laparotomy nevertheless.  I have also told her that there is a chance that surgery may not be able to go until even later if an emergent case gets added on before hers and that she would have to remain NPO until then.  She would prefer to eat now and have appendectomy with Dr. Michela PitcherEly tomorrow.  Will admit for IV  antibiotics, resuscitation and pain control with appendectomy in am.  Will leave final decision regarding attempt at laparoscopy vs direct open appendectomy to Dr. Michela Pitcher.  I have also explained to her that she is at high risk of postoperative abscess.  I have explained the risks and benefits of surgery and all questions have been answered to her and her family's satisfaction.   Electronic Signatures: Jarvis Newcomer (MD)  (Signed 04-Mar-15 22:40)  Authored: CHIEF COMPLAINT and HISTORY, ALLERGIES, OTHER MEDICATIONS, FAMILY AND SOCIAL HISTORY, REVIEW OF SYSTEMS, PHYSICAL EXAM, ASSESSMENT AND PLAN   Last Updated: 04-Mar-15 22:40 by Jarvis Newcomer (MD)

## 2014-10-27 NOTE — Discharge Summary (Signed)
PATIENT NAME:  Heather Gonzalez, Heather Gonzalez MR#:  [id number] DATE OF BIRTH:  [birth year]  DATE OF ADMISSION:  09/06/2013 DATE OF DISCHARGE:  09/13/2013  BRIEF HISTORY: [name] is a 24 year old woman admitted to the hospital on 09/06/2013 with abdominal pain. Workup suggesting a ruptured appendix with an abscess. She was admitting late in the evening, resuscitated over the course of the evening and taken to surgery the following day. Laparoscopy was attempted, but the appendix could not be visualized and there did appear to be a large intraperitoneal abscess. The procedure was converted to an open appendectomy. The appendix was necrotic and in multiple pieces. It was removed without difficulty. The abscess was drained and a drain placed. She has continued on antibiotics. She had very slow return of bowel function, but no significant postoperative problems. White cell count returned to normal. Pain was tolerable and she remained afebrile. She was discharged home on the 11th to be followed in the office in 7 to 10 days' time. Bathing, activity, and driving instructions were given to the patient. She was discharged home on meloxicam 7.5 mg b.i.d., Phenazopyridine 100 mg 3 times a day and acetaminophen/hydrocodone 325/5 mg every 4 to 6 hours p.r.n.   FINAL DISCHARGE DIAGNOSIS: Acute ruptured appendicitis.   SURGERY: Laparoscopy with conversion to open appendectomy.  ____________________________ Quentin Orealph L. Ely III, MD rle:aw D: 09/27/2013 09:32:51 ET T: 09/27/2013 09:39:02 ET JOB#: [id number]  cc: Carmie Endalph L. Ely III, MD, <Dictator> Danella PentonMark F. Miller, MD Quentin OreALPH L ELY MD ELECTRONICALLY SIGNED 09/28/2013 11:54

## 2014-10-27 NOTE — Discharge Summary (Signed)
Dates of Admission and Diagnosis:  Date of Admission 14-Jun-2014   Date of Discharge 17-Jun-2014   Admitting Diagnosis Sepsis, right pylenephritis   Final Diagnosis Sepsis, right pyelonephritis, bilateral lower lobe pneumonia   Discharge Diagnosis 1 Sepsis, right pyelonephritis, bilateral lower lobe pneumonia    Chief Complaint/History of Present Illness 24 year old lady presented to the ED with right flank pain, fever, chills and dysuria. Urinalysis was suggestive of UTI. CT abdomen was consistent with Right pyelonephritis. Patient was admitted for further management.   Allergies:  Codeine: Rash    Hepatic:  09-Dec-15 21:29   Bilirubin, Total 0.9  Alkaline Phosphatase 57 (46-116 NOTE: New Reference Range 01/23/14)  SGPT (ALT) 19 (14-63 NOTE: New Reference Range 01/23/14)  SGOT (AST)  12  Total Protein, Serum 8.1  Albumin, Serum 4.2  Routine Micro:  09-Dec-15 21:30   Micro Text Report URINE CULTURE   ORGANISM 1                >100,000 CFU/ML Escherichia coli   ANTIBIOTIC                    ORG#1     AMPICILLIN                    S         CEFAZOLIN                     S         CEFOXITIN                     S         CEFTRIAXONE                   S         CIPROFLOXACIN                 S         GENTAMICIN                    R         IMIPENEM                      S         LEVOFLOXACIN                  S         NITROFURANTOIN                S         Trimethoprim/Sulfamethoxazole S  Culture Comment ID TO FOLLOW SENSITIVITIES TO FOLLOW  Result(s) reported on 15 Jun 2014 at 08:14AM.  Organism Name Escherichia coli  Organism Quantity >100,000 CFU/ML  Nitrofurantoin Sensitivity S  Cefazolin Sensitivity S  Ampicillin Sensitivity S  Ceftriaxone Sensitivity S  Ciprofloxacin Sensitivity S  Gentamicin Sensitivity R  Imipenem Sensitivity S  Levofloxacin Sensitivity S  Trimethoprim/Sulfamethoxazole Sensitivty S  Cefoxitin Sensitivity S  Specimen Source CLEAN CATCH   Organism 1 >100,000 CFU/ML Escherichia coli  Result(s) reported on 16 Jun 2014 at 11:15AM.    23:45   Micro Text Report BLOOD CULTURE   COMMENT                   NO GROWTH IN 48 HOURS   ANTIBIOTIC  Culture Comment NO GROWTH IN 48 HOURS  Result(s) reported on 16 Jun 2014 at 12:00AM.    23:55   Micro Text Report BLOOD CULTURE   COMMENT                   NO GROWTH IN 48 HOURS   ANTIBIOTIC                       Culture Comment NO GROWTH IN 48 HOURS  Result(s) reported on 16 Jun 2014 at 12:00AM.  Lab:  09-Dec-15 23:30   pH (ABG)  7.460 (7.350-7.450 NOTE: New Reference Range 01/27/14)  PCO2 34 (32-48 NOTE: New Reference Range 02/13/14)  PO2  76 (83-108 NOTE: New Reference Range 01/27/14)  FiO2 21  Base Excess 1 (-3-3 NOTE: New Reference Range 02/13/14)  HCO3 24.2 (21.0-28.0 NOTE: New Reference Range 01/27/14)  O2 Saturation 95.2  Specimen Site (ABG) RT RADIAL  Specimen Type (ABG) ARTERIAL  Patient Temp (ABG) 37.0 (Result(s) reported on 13 Jun 2014 at 11:45PM.)  Lactic Acid, Arterial, Cardiopulmonary 0.8 (0.3-0.8 NOTE: New Reference Range 02/13/14)  Routine Chem:  09-Dec-15 21:29   Glucose, Serum 88  BUN 13  Creatinine (comp) 0.92  Sodium, Serum 142  Potassium, Serum  3.4  Chloride, Serum 107  CO2, Serum 27  Calcium (Total), Serum 9.0  Anion Gap 8  Osmolality (calc) 283  eGFR (African American) >60  eGFR (Non-African American) >60 (eGFR values <89m/min/1.73 m2 may be an indication of chronic kidney disease (CKD). Calculated eGFR, using the MRDR Study equation, is useful in  patients with stable renal function. The eGFR calculation will not be reliable in acutely ill patients when serum creatinine is changing rapidly. It is not useful in patients on dialysis. The eGFR calculation may not be applicable to patients at the low and high extremes of body sizes, pregnant women, and vegetarians.)  Magnesium, Serum 2.0 (1.8-2.4 THERAPEUTIC RANGE:  4-7 mg/dL TOXIC: > 10 mg/dL  -----------------------)  Phosphorus, Serum 3.0 (Result(s) reported on 14 Jun 2014 at 12:05AM.)  Lipase 95 (Result(s) reported on 13 Jun 2014 at 10:26PM.)  11-Dec-15 05:00   Potassium, Serum 3.6  13-Dec-15 04:58   Potassium, Serum  3.4  Cardiac:  09-Dec-15 21:29   Troponin I < 0.02 (0.00-0.05 0.05 ng/mL or less: NEGATIVE  Repeat testing in 3-6 hrs  if clinically indicated. >0.05 ng/mL: POTENTIAL  MYOCARDIAL INJURY. Repeat  testing in 3-6 hrs if  clinically indicated. NOTE: An increase or decrease  of 30% or more on serial  testing suggests a  clinically important change)  Routine UA:  09-Dec-15 21:30   Color (UA) Yellow  Clarity (UA) Clear  Glucose (UA) Negative  Bilirubin (UA) Negative  Ketones (UA) Negative  Specific Gravity (UA) 1.012  Blood (UA) 2+  pH (UA) 6.0  Protein (UA) 30 mg/dL  Nitrite (UA) Positive  Leukocyte Esterase (UA) 2+ (Result(s) reported on 13 Jun 2014 at 10:16PM.)  RBC (UA) 4 /HPF  WBC (UA) 87 /HPF  Bacteria (UA) 2+  Epithelial Cells (UA) 2 /HPF  Mucous (UA) PRESENT (Result(s) reported on 13 Jun 2014 at 10:16PM.)  Routine Sero:  09-Dec-15 21:30   Pregnancy Test, Urine NEGATIVE (The results of the qualitative urine HCG (Pregnancy Test) should be evaluated in light of other clinical information.  There are limitations to the test which, in certain clinical situations, may result in a false positive or negative result. Thehigh dose hook effect can occur in urine samples with  extremely high HCG concentrations.  This effect can produce a negative result in certain situations. It is suggested that results of the qualitative HCG be confirmed by an alternate methodology, such as the quantitative serum beta HCG test.)  Routine Coag:  09-Dec-15 21:29   Prothrombin 13.4  INR 1.0 (INR reference interval applies to patients on anticoagulant therapy. A single INR therapeutic range for coumarins is not optimal for  all indications; however, the suggested range for most indications is 2.0 - 3.0. Exceptions to the INR Reference Range may include: Prosthetic heart valves, acute myocardial infarction, prevention of myocardial infarction, and combinations of aspirin and anticoagulant. The need for a higher or lower target INR must be assessed individually. Reference: The Pharmacology and Management of the Vitamin K  antagonists: the seventh ACCP Conference on Antithrombotic and Thrombolytic Therapy. XTAVW.9794 Sept:126 (3suppl): [id number]. A HCT value >55% may artifactually increase the PT.  In one study,  the increase was an average of 25%. Reference:  "Effect on Routine and Special Coagulation Testing Values of Citrate Anticoagulant Adjustment in Patients with High HCT Values." American Journal of Clinical Pathology 2006;126:400-405.)  Routine Hem:  09-Dec-15 21:29   WBC (CBC)  13.0  RBC (CBC) 4.52  Hemoglobin (CBC) 13.9  Hematocrit (CBC) 42.1  Platelet Count (CBC) 192  MCV 93  MCH 30.6  MCHC 33.0  RDW 13.6  Neutrophil % 80.5  Lymphocyte % 10.2  Monocyte % 9.0  Eosinophil % 0.0  Basophil % 0.3  Neutrophil #  10.4  Lymphocyte # 1.3  Monocyte #  1.2  Eosinophil # 0.0  Basophil # 0.0 (Result(s) reported on 13 Jun 2014 at 10:12PM.)   PERTINENT RADIOLOGY STUDIES: XRay:    09-Dec-15 23:19, Chest Portable Single View  Chest Portable Single View   REASON FOR EXAM:    Sepsis  COMMENTS:       PROCEDURE: DXR - DXR PORTABLE CHEST SINGLE VIEW  - Jun 13 2014 11:19PM     CLINICAL DATA:  Fever, chills, and right-sided flank pain since  Saturday.    EXAM:  PORTABLE CHEST - 1 VIEW    COMPARISON:  09/08/2013    FINDINGS:  The heart size and mediastinal contours are within normal limits.  Both lungs are clear. The visualized skeletal structures are  unremarkable.     IMPRESSION:  No active disease.      Electronically Signed    By: Lucienne Capers M.D.    On: 06/14/2014 00:07          Verified By: Neale Burly, M.D.,  [place]:    11-Dec-15 16:23, CT ANGIOGRAPHY Chest with for PE  PACS Image   CT:    10-Dec-15 00:09, CT Abdomen and Pelvis With Contrast  CT Abdomen and Pelvis With Contrast   REASON FOR EXAM:    (1) right flank pain; (2) right flank pain  COMMENTS:       PROCEDURE: CT  - CT ABDOMEN / PELVIS  W  - Jun 14 2014 12:09AM     CLINICAL DATA:  RIGHT flank pain beginning 5 days ago, fever.  History of pelvic abscess.    EXAM:  CT ABDOMEN AND PELVIS WITH CONTRAST    TECHNIQUE:  Multidetector CT imaging of the abdomen and pelvis was performed  using the standard protocol following bolus administration of  intravenous contrast.  CONTRAST:  80 cc Isovue 370    COMPARISON:  CT of the abdomen and pelvis September 27, 2013    FINDINGS:  LUNG BASES: Included view of the lung bases are clear. Visualized  heart and pericardium are unremarkable.    SOLID ORGANS: The liver, spleen, gallbladder, pancreas and adrenal  glands are unremarkable.    GASTROINTESTINAL TRACT: The stomach, small and large bowel are  normal in course and caliber without inflammatory changes. Mild  amount of retained large bowel stool. Status post apparent  appendectomy.  KIDNEYS/ URINARY TRACT: Kidneys are orthotopic, demonstrate normal  morphology do patchy areas of hypoenhancement within the RIGHT  kidney, associated with mild uroepithelial enhancement. Mild RIGHT  hydronephrosis. No nephrolithiasis. The unopacified ureters are  normal in course and caliber. Urinary bladder is partially distended  and unremarkable.    PERITONEUM/RETROPERITONEUM: Small to moderate amount of low-density  free fluid in the pelvis. No residual abscess. Aortoiliac vessels  are normal in course and caliber. No lymphadenopathy by CT size  criteria. Internal reproductive organs are unremarkable.    SOFT TISSUE/OSSEOUS STRUCTURES: Nonsuspicious. Scattered Schmorl's  nodes. 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   IMPRESSION:  Patchy hypoenhancement RIGHT kidney associated with uroepithelial  enhancement consistent with pyelonephritis. Mild RIGHT  hydronephrosis.    No residual pelvic abscess.      Electronically Signed    By: Elon Alas    On: 06/14/2014 01:00         Verified By: Ricky Ala, M.D.,   Pertinent Past History:  Pertinent Past History None   Hospital Course:  Hospital Course Patient was started on IV fluids for hydration and IV Rocephin and oral Levaquin for Sepsis and Right pyelonephritis. She complained of pleuritic chest pain and mild SOB. D-Dimer was elevated. CT chest was done for possible PE. CT chest was negative for PE, but revealed bilateral lower lobe pneumonia, R>L. IV rocephin and oral levaquin were continued. Prelim urine c/s revealed gram negative rod. Prelim blood c/s are negative at 48 hours. Patient received oral potassium supplement for mild hypokalemia with potassium of 3.4 prior to discharge.   Condition on Discharge Satisfactory   DISCHARGE INSTRUCTIONS HOME MEDS:  Medication Reconciliation: Patient's Home Medications at Discharge:     Medication Instructions  nexplanon 68 mg subcutaneous implant  1 each subcutaneous once   levofloxacin 500 mg oral tablet  1 tab(s) orally once a day     Physician's Instructions:  Diet Regular   Activity Limitations As tolerated   Return to Work after follow up visit with MD    Time frame for Follow Up Appointment 1-2 weeks  Dr Sabra Heck 1 week     Emily Filbert F(Family Physician): [facility], [address] [place], [place] [address], [place] [phone]  Electronic Signatures: Glendon Axe (MD)  (Signed 13-Dec-15 09:45)  Authored: ADMISSION DATE AND DIAGNOSIS, CHIEF COMPLAINT/HPI, Allergies, PERTINENT LABS, PERTINENT RADIOLOGY STUDIES, PERTINENT PAST HISTORY, HOSPITAL COURSE, [place], PATIENT INSTRUCTIONS, Follow Up  Physician   Last Updated: 13-Dec-15 09:45 by Glendon Axe (MD)

## 2015-03-09 IMAGING — CT CT ABD-PELV W/ CM
2 of 4 series · 15 of 46 positions shown, 17 images · IV contrast (isovue)
Comparison: CT of the abdomen and pelvis September 27, 2013

CLINICAL DATA: RIGHT flank pain beginning 5 days ago, fever.
History of pelvic abscess.

EXAM:
CT ABDOMEN AND PELVIS WITH CONTRAST
TECHNIQUE: Multidetector CT imaging of the abdomen and pelvis was performed
using the standard protocol following bolus administration of
intravenous contrast.
CONTRAST:  80 cc Isovue 370

[Series 2: routine abd pel with · axial · 0.62mm/px · z∈[-1281,-881]mm · 12 of 92 slices shown, 14 images]
[im 8/92  soft-tissue]
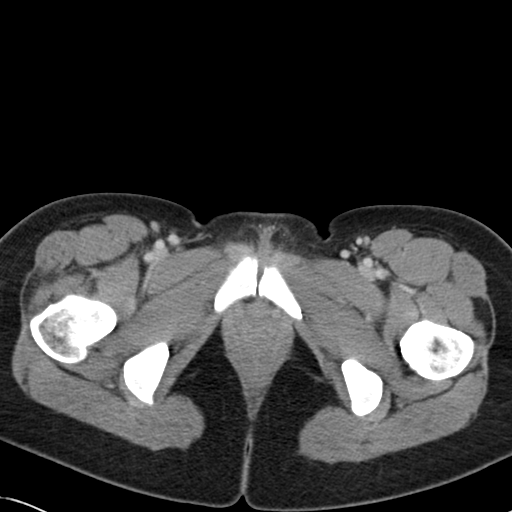
[im 8/92  bone]
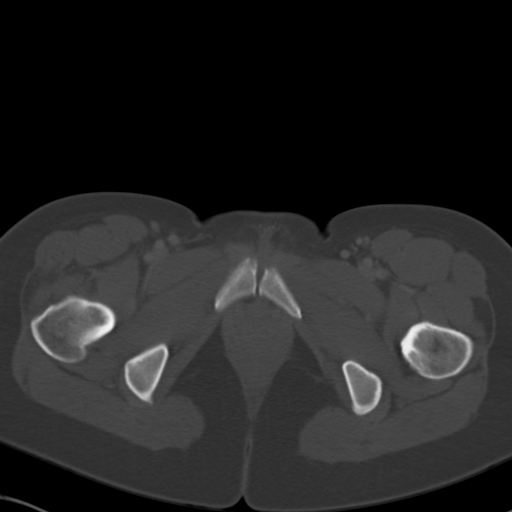
[im 15/92  soft-tissue]
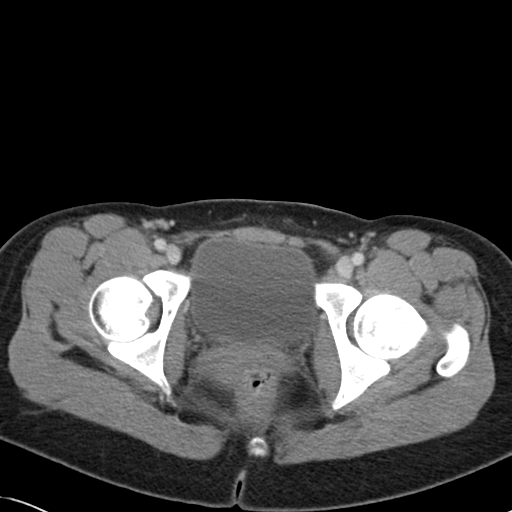
[im 22/92  soft-tissue]
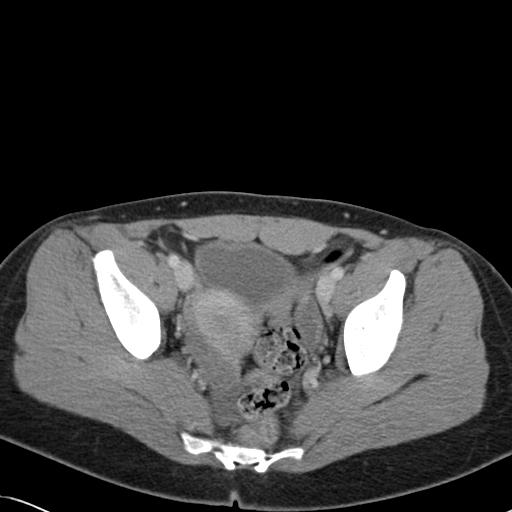
[im 30/92  soft-tissue]
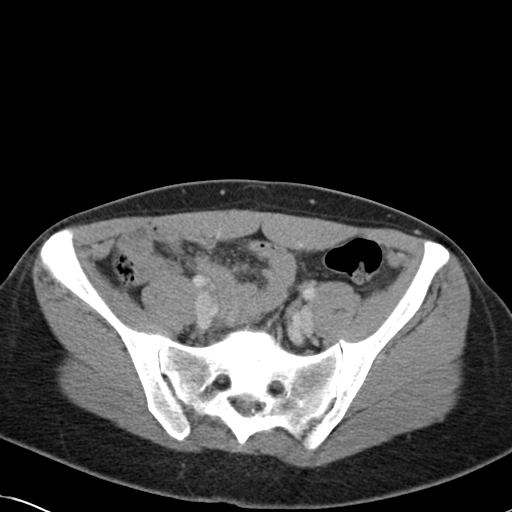
[im 37/92  soft-tissue]
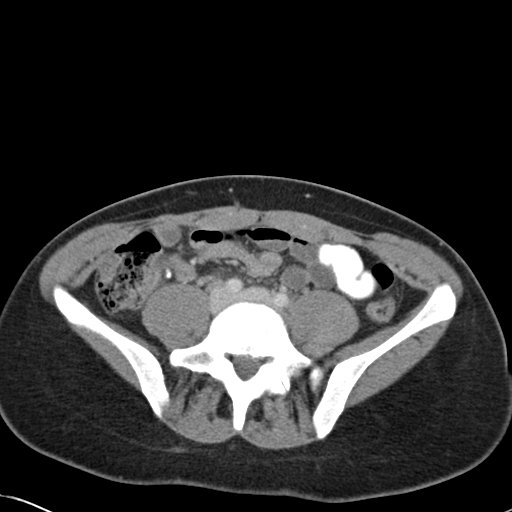
[im 44/92  soft-tissue]
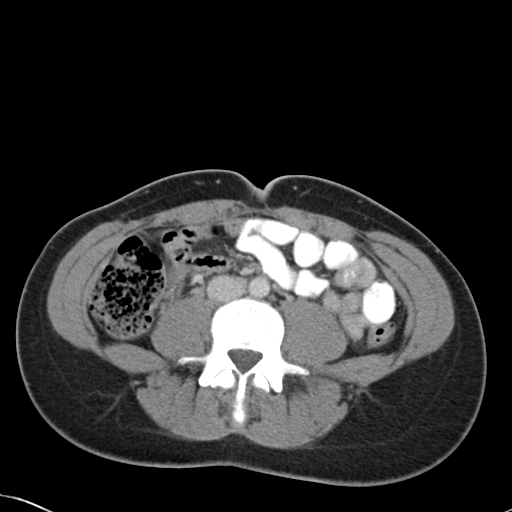
[im 51/92  soft-tissue]
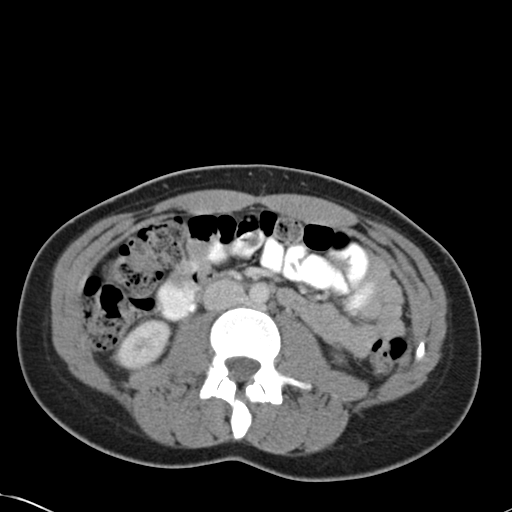
[im 59/92  soft-tissue]
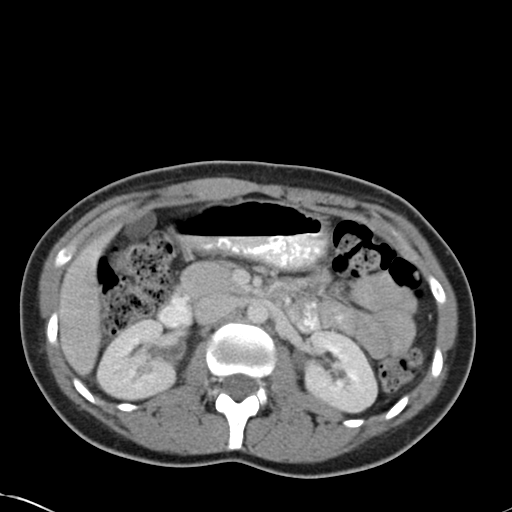
[im 66/92  soft-tissue]
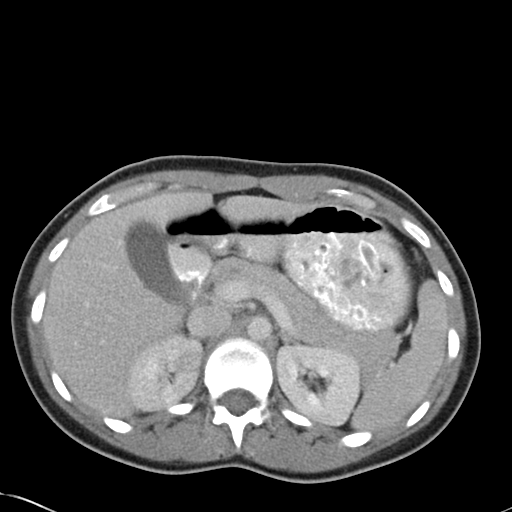
[im 66/92  bone]
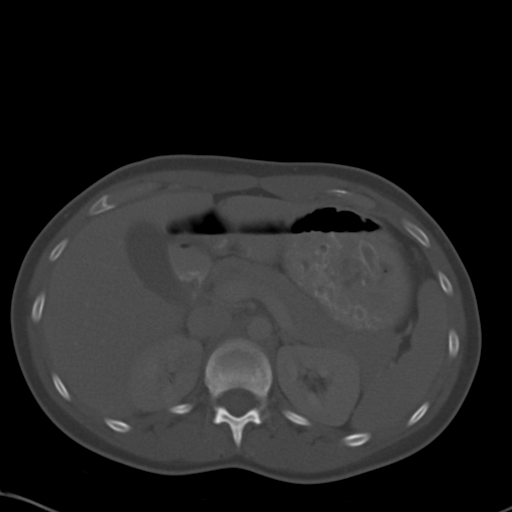
[im 73/92  soft-tissue]
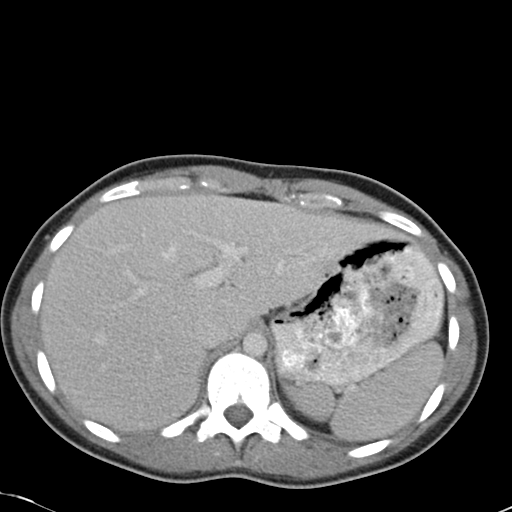
[im 81/92  soft-tissue]
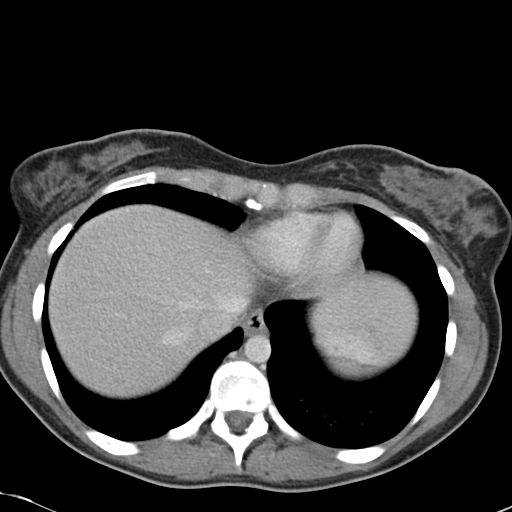
[im 88/92  soft-tissue]
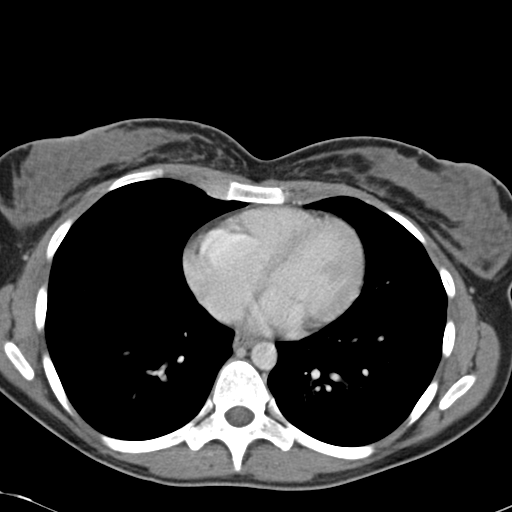

[Series 5: cor routine abd pel with · coronal · 0.61mm/px · 3 of 97 slices shown]
[im 33/97  soft-tissue]
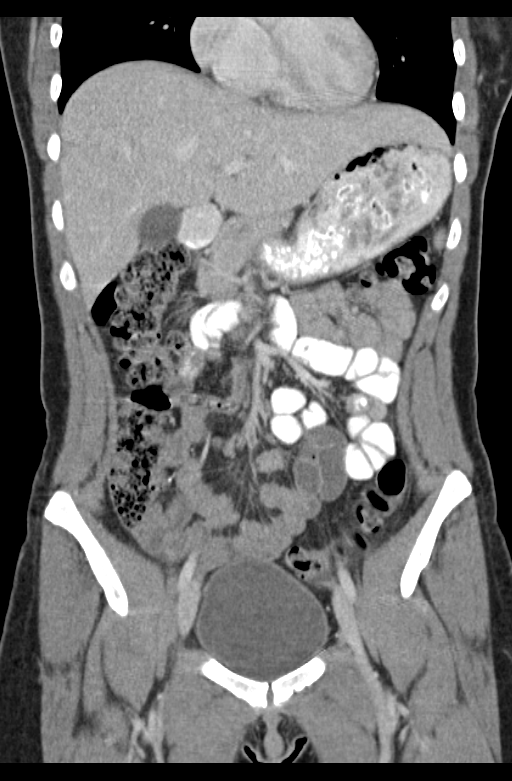
[im 43/97  soft-tissue]
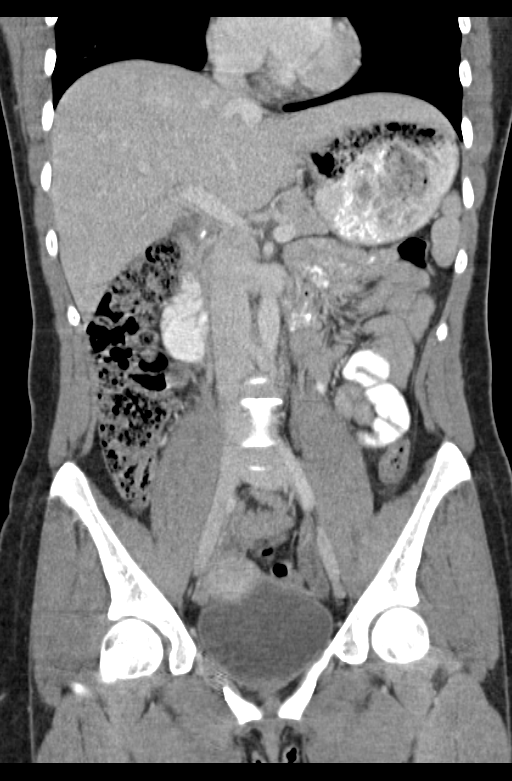
[im 54/97  soft-tissue]
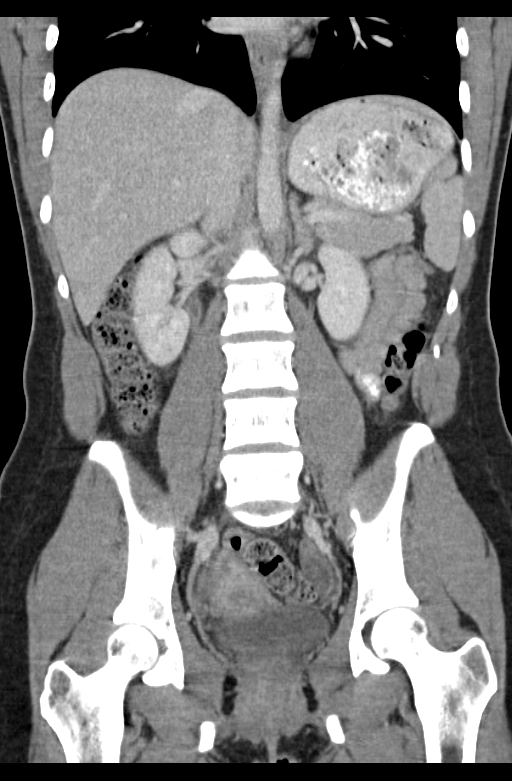

[15 of 46 positions shown; findings below may reference images not displayed]

FINDINGS: LUNG BASES: Included view of the lung bases are clear. Visualized
heart and pericardium are unremarkable.

SOLID ORGANS: The liver, spleen, gallbladder, pancreas and adrenal
glands are unremarkable.

GASTROINTESTINAL TRACT: The stomach, small and large bowel are
normal in course and caliber without inflammatory changes. Mild
amount of retained large bowel stool. Status post apparent
appendectomy.

KIDNEYS/ URINARY TRACT: Kidneys are orthotopic, demonstrate normal
morphology do patchy areas of hypoenhancement within the RIGHT
kidney, associated with mild uroepithelial enhancement. Mild RIGHT
hydronephrosis. No nephrolithiasis. The unopacified ureters are
normal in course and caliber. Urinary bladder is partially distended
and unremarkable.

PERITONEUM/RETROPERITONEUM: Small to moderate amount of low-density
free fluid in the pelvis. No residual abscess. Aortoiliac vessels
are normal in course and caliber. No lymphadenopathy by CT size
criteria. Internal reproductive organs are unremarkable.

SOFT TISSUE/OSSEOUS STRUCTURES: Nonsuspicious. Scattered Schmorl's
nodes. Partially imaged T7-8 segmentation anomaly.
IMPRESSION: Patchy hypoenhancement RIGHT kidney associated with uroepithelial
enhancement consistent with pyelonephritis. Mild RIGHT
hydronephrosis.

No residual pelvic abscess.

  By: Abdlilah Garib
